# Patient Record
Sex: Female | Born: 1990 | Race: White | Hispanic: No | Marital: Single | Smoking: Never smoker
Health system: Southern US, Community
[De-identification: ages and names within clinical notes are randomized; demographics above are authoritative.]

## PROBLEM LIST (undated history)

## (undated) DIAGNOSIS — N946 Dysmenorrhea, unspecified: Secondary | ICD-10-CM

## (undated) DIAGNOSIS — N390 Urinary tract infection, site not specified: Secondary | ICD-10-CM

## (undated) HISTORY — PX: WISDOM TOOTH EXTRACTION: SHX21

## (undated) HISTORY — DX: Dysmenorrhea, unspecified: N94.6

## (undated) HISTORY — DX: Urinary tract infection, site not specified: N39.0

---
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
## 2011-01-24 LAB — WET PREP, GENITAL
Clue Cells Wet Prep HPF POC: NONE SEEN
Yeast Wet Prep HPF POC: NONE SEEN

## 2011-01-24 LAB — GC/CHLAMYDIA PROBE AMP, GENITAL
Chlamydia, DNA Probe: NEGATIVE
GC Probe Amp, Genital: NEGATIVE

## 2011-01-24 LAB — URINALYSIS, ROUTINE W REFLEX MICROSCOPIC
Bilirubin Urine: NEGATIVE
Ketones, ur: NEGATIVE mg/dL
Nitrite: NEGATIVE
Urobilinogen, UA: 0.2 mg/dL (ref 0.0–1.0)

## 2013-06-16 ENCOUNTER — Ambulatory Visit (INDEPENDENT_AMBULATORY_CARE_PROVIDER_SITE_OTHER): Payer: BC Managed Care – PPO | Admitting: Nurse Practitioner

## 2013-06-16 ENCOUNTER — Encounter: Payer: Self-pay | Admitting: Nurse Practitioner

## 2013-06-16 VITALS — BP 110/64 | HR 76 | Ht 65.0 in | Wt 136.0 lb

## 2013-06-16 DIAGNOSIS — N9089 Other specified noninflammatory disorders of vulva and perineum: Secondary | ICD-10-CM

## 2013-06-16 MED ORDER — FLUCONAZOLE 150 MG PO TABS: 150.0000 mg | ORAL_TABLET | Freq: Once | ORAL | Status: DC

## 2013-06-16 NOTE — Progress Notes (Signed)
Subjective:     Patient ID: Caitlin Shaw, female   DOB: [birth year], 22 y.o.   MRN: [id number]  HPI  22 yo SW Fe presents with symptoms of vaginitis X 2 days. Vaginal discharge is scant, no odor, no bladder symptoms. No fever/chills.  She did use  new brand of tampons this last cycle. No other changes in personal products. She is going out of town this weekend with her boyfriend and his parents   Review of Systems  Constitutional: Negative for fever, chills and fatigue.  Respiratory: Negative.   Cardiovascular: Negative.   Gastrointestinal: Negative.   Genitourinary: Negative for dysuria, urgency, frequency, flank pain, decreased urine volume, genital sores, pelvic pain and dyspareunia.       Last sexually active 1 week ago.  Same monogamous partner.  On OCP. Just finished cycle yesterday.  Skin: Negative.   Neurological: Negative.   Psychiatric/Behavioral: Negative.        Objective:   Physical Exam  Constitutional: She is oriented to person, place, and time. She appears well-developed and well-nourished.  Abdominal: Soft. She exhibits no distension and no mass. There is no tenderness. There is no rebound and no guarding.  Genitourinary:  exteanal vulva areas are slight red with irritation like scratch marks. There is no vaginal discharge. Look like an allergic reaction.  Neurological: She is alert and oriented to person, place, and time.  Psychiatric: She has a normal mood and affect. Her behavior is normal. Judgment and thought content normal.       Assessment:     Vulvar irritation maybe from an allergic reaction to change of products     Plan:     Suggest OTC hydrocortisone cream prn Will give her Diflucan 150 in case this is a early yeast infection.

## 2013-06-17 NOTE — Progress Notes (Signed)
Encounter reviewed by Dr. Brook Silva.  

## 2013-07-13 ENCOUNTER — Other Ambulatory Visit: Payer: Self-pay | Admitting: Nurse Practitioner

## 2013-07-13 DIAGNOSIS — Z3040 Encounter for surveillance of contraceptives, unspecified: Secondary | ICD-10-CM

## 2013-07-13 NOTE — Telephone Encounter (Signed)
[facility] request for refill on Microgestin 1/20 Fe #84 Last filled - 04-23-13 Last AEX - 07-13-12 Next AEX - 08-01-13

## 2013-08-01 ENCOUNTER — Ambulatory Visit: Payer: Self-pay | Admitting: Nurse Practitioner

## 2013-08-26 ENCOUNTER — Ambulatory Visit: Payer: Self-pay | Admitting: Nurse Practitioner

## 2013-09-09 ENCOUNTER — Ambulatory Visit (INDEPENDENT_AMBULATORY_CARE_PROVIDER_SITE_OTHER): Payer: BC Managed Care – PPO | Admitting: Nurse Practitioner

## 2013-09-09 ENCOUNTER — Encounter: Payer: Self-pay | Admitting: Nurse Practitioner

## 2013-09-09 VITALS — BP 110/66 | HR 92 | Ht 65.5 in | Wt 142.0 lb

## 2013-09-09 DIAGNOSIS — Z01419 Encounter for gynecological examination (general) (routine) without abnormal findings: Secondary | ICD-10-CM

## 2013-09-09 DIAGNOSIS — Z0000 Encounter for general adult medical examination without abnormal findings: Secondary | ICD-10-CM

## 2013-09-09 LAB — POCT URINALYSIS DIPSTICK
Blood, UA: NEGATIVE
Glucose, UA: NEGATIVE
Ketones, UA: NEGATIVE
Protein, UA: NEGATIVE
Urobilinogen, UA: NEGATIVE

## 2013-09-09 MED ORDER — NORETHIN ACE-ETH ESTRAD-FE 1-20 MG-MCG PO TABS: 1.0000 | ORAL_TABLET | Freq: Every day | ORAL | Status: DC

## 2013-09-09 NOTE — Progress Notes (Signed)
Patient ID: Caitlin Shaw, female   DOB: 16-May-1991, 22 y.o.   MRN: [id number] 22 y.o. G0P0 Single Caucasian Fe here for annual exam.  [facility] at [place].   Menses now at 4-5 days, moderate to light. Occasional mild cramps.  Same partner for 6 years.  No concerns about STD's.   Patient's last menstrual period was 09/02/2013.          Sexually active: yes  The current method of family planning is OCP (estrogen/progesterone) and condoms most of the time.    Exercising: yes  Home exercise routine includes running 3-5 times per week. Smoker:  no  Health Maintenance: TDaP:  UTD ? 2012 Gardasil: completed in 2012 Labs: HB: 12.6 Urine: negative, pH 6.5   reports that she has never smoked. She has never used smokeless tobacco. She reports that she drinks about 1.0 ounces of alcohol per week. She reports that she does not use illicit drugs.  Past Medical History  Diagnosis Date  . Dysmenorrhea     Past Surgical History  Procedure Laterality Date  . Wisdom tooth extraction      Current Outpatient Prescriptions  Medication Sig Dispense Refill  . fluconazole (DIFLUCAN) 150 MG tablet Take 1 tablet (150 mg total) by mouth once. Take one tablet.  Repeat in 48 hours if symptoms are not completely resolved.  2 tablet  0  . minocycline (MINOCIN,DYNACIN) 100 MG capsule Take 100 mg by mouth 2 (two) times daily.      . norethindrone-ethinyl estradiol (JUNEL FE,GILDESS FE,LOESTRIN FE) 1-20 MG-MCG tablet Take 1 tablet by mouth daily.  3 Package  3   No current facility-administered medications for this visit.    Family History  Problem Relation Age of Onset  . Osteoporosis Mother   . Thyroid disease Father     ROS:  Pertinent items are noted in HPI.  Otherwise, a comprehensive ROS was negative.  Exam:   BP 110/66  Pulse 92  Ht 5' 5.5" (1.664 m)  Wt 142 lb (64.411 kg)  BMI 23.26 kg/m2  LMP 09/02/2013 Height: 5' 5.5" (166.4 cm)  Ht Readings from Last 3 Encounters:  09/09/13 5' 5.5" (1.664  m)  06/16/13 5\' 5"  (1.651 m)    General appearance: alert, cooperative and appears stated age Head: Normocephalic, without obvious abnormality, atraumatic Neck: no adenopathy, supple, symmetrical, trachea midline and thyroid normal to inspection and palpation Lungs: clear to auscultation bilaterally Breasts: normal appearance, no masses or tenderness Heart: regular rate and rhythm Abdomen: soft, non-tender; no masses,  no organomegaly Extremities: extremities normal, atraumatic, no cyanosis or edema Skin: Skin color, texture, turgor normal. No rashes or lesions Lymph nodes: Cervical, supraclavicular, and axillary nodes normal. No abnormal inguinal nodes palpated Neurologic: Grossly normal   Pelvic: External genitalia:  no lesions              Urethra:  normal appearing urethra with no masses, tenderness or lesions              Bartholin's and Skene's: normal                 Vagina: normal appearing vagina with normal color and discharge, no lesions              Cervix: anteverted              Pap taken: yes Bimanual Exam:  Uterus:  normal size, contour, position, consistency, mobility, non-tender  Adnexa: no mass, fullness, tenderness               Rectovaginal: Confirms               Anus:  normal sphincter tone, no lesions  A:  Well Woman with normal exam  Contraception  History of dysmenorrhea  P:   Pap smear as per guidelines Pap done today  Refill on Loestrin 1/20 for a year  Counseled on breast self exam, STD prevention, adequate intake of calcium and vitamin D, diet and exercise return annually or prn  An After Visit Summary was printed and given to the patient.

## 2013-09-09 NOTE — Patient Instructions (Signed)
General topics  Next pap or exam is  due in 1 year Take a Women's multivitamin Take 1200 mg. of calcium daily - prefer dietary If any concerns in interim to call back  Breast Self-Awareness Practicing breast self-awareness may pick up problems early, prevent significant medical complications, and possibly save your life. By practicing breast self-awareness, you can become familiar with how your breasts look and feel and if your breasts are changing. This allows you to notice changes early. It can also offer you some reassurance that your breast health is good. One way to learn what is normal for your breasts and whether your breasts are changing is to do a breast self-exam. If you find a lump or something that was not present in the past, it is best to contact your caregiver right away. Other findings that should be evaluated by your caregiver include nipple discharge, especially if it is bloody; skin changes or reddening; areas where the skin seems to be pulled in (retracted); or new lumps and bumps. Breast pain is seldom associated with cancer (malignancy), but should also be evaluated by a caregiver. BREAST SELF-EXAM The best time to examine your breasts is 5 7 days after your menstrual period is over.  ExitCare Patient Information 2013 [employer].   Exercise to Stay Healthy Exercise helps you become and stay healthy. EXERCISE IDEAS AND TIPS Choose exercises that:  You enjoy.  Fit into your day. You do not need to exercise really hard to be healthy. You can do exercises at a slow or medium level and stay healthy. You can:  Stretch before and after working out.  Try yoga, Pilates, or tai chi.  Lift weights.  Walk fast, swim, jog, run, climb stairs, bicycle, dance, or rollerskate.  Take aerobic classes. Exercises that burn about 150 calories:  Running 1  miles in 15 minutes.  Playing volleyball for 45 to 60 minutes.  Washing and waxing a car for 45 to 60  minutes.  Playing touch football for 45 minutes.  Walking 1  miles in 35 minutes.  Pushing a stroller 1  miles in 30 minutes.  Playing basketball for 30 minutes.  Raking leaves for 30 minutes.  Bicycling 5 miles in 30 minutes.  Walking 2 miles in 30 minutes.  Dancing for 30 minutes.  Shoveling snow for 15 minutes.  Swimming laps for 20 minutes.  Walking up stairs for 15 minutes.  Bicycling 4 miles in 15 minutes.  Gardening for 30 to 45 minutes.  Jumping rope for 15 minutes.  Washing windows or floors for 45 to 60 minutes. Document Released: 11/29/2010 Document Revised: 01/19/2012 Document Reviewed: 11/29/2010 ExitCare Patient Information 2013 [employer].   Other topics ( that may be useful information):    Sexually Transmitted Disease Sexually transmitted disease (STD) refers to any infection that is passed from person to person during sexual activity. This may happen by way of saliva, semen, blood, vaginal mucus, or urine. Common STDs include:  Gonorrhea.  Chlamydia.  Syphilis.  HIV/AIDS.  Genital herpes.  Hepatitis B and C.  Trichomonas.  Human papillomavirus (HPV).  Pubic lice. CAUSES  An STD may be spread by bacteria, virus, or parasite. A person can get an STD by:  Sexual intercourse with an infected person.  Sharing sex toys with an infected person.  Sharing needles with an infected person.  Having intimate contact with the genitals, mouth, or rectal areas of an infected person. SYMPTOMS  Some people may not have any symptoms, but   they can still pass the infection to others. Different STDs have different symptoms. Symptoms include:  Painful or bloody urination.  Pain in the pelvis, abdomen, vagina, anus, throat, or eyes.  Skin rash, itching, irritation, growths, or sores (lesions). These usually occur in the genital or anal area.  Abnormal vaginal discharge.  Penile discharge in men.  Soft, flesh-colored skin growths in the  genital or anal area.  Fever.  Pain or bleeding during sexual intercourse.  Swollen glands in the groin area.  Yellow skin and eyes (jaundice). This is seen with hepatitis. DIAGNOSIS  To make a diagnosis, your caregiver may:  Take a medical history.  Perform a physical exam.  Take a specimen (culture) to be examined.  Examine a sample of discharge under a microscope.  Perform blood test TREATMENT   Chlamydia, gonorrhea, trichomonas, and syphilis can be cured with antibiotic medicine.  Genital herpes, hepatitis, and HIV can be treated, but not cured, with prescribed medicines. The medicines will lessen the symptoms.  Genital warts from HPV can be treated with medicine or by freezing, burning (electrocautery), or surgery. Warts may come back.  HPV is a virus and cannot be cured with medicine or surgery.However, abnormal areas may be followed very closely by your caregiver and may be removed from the cervix, vagina, or vulva through office procedures or surgery. If your diagnosis is confirmed, your recent sexual partners need treatment. This is true even if they are symptom-free or have a negative culture or evaluation. They should not have sex until their caregiver says it is okay. HOME CARE INSTRUCTIONS  All sexual partners should be informed, tested, and treated for all STDs.  Take your antibiotics as directed. Finish them even if you start to feel better.  Only take over-the-counter or prescription medicines for pain, discomfort, or fever as directed by your caregiver.  Rest.  Eat a balanced diet and drink enough fluids to keep your urine clear or pale yellow.  Do not have sex until treatment is completed and you have followed up with your caregiver. STDs should be checked after treatment.  Keep all follow-up appointments, Pap tests, and blood tests as directed by your caregiver.  Only use latex condoms and water-soluble lubricants during sexual activity. Do not use  petroleum jelly or oils.  Avoid alcohol and illegal drugs.  Get vaccinated for HPV and hepatitis. If you have not received these vaccines in the past, talk to your caregiver about whether one or both might be right for you.  Avoid risky sex practices that can break the skin. The only way to avoid getting an STD is to avoid all sexual activity.Latex condoms and dental dams (for oral sex) will help lessen the risk of getting an STD, but will not completely eliminate the risk. SEEK MEDICAL CARE IF:   You have a fever.  You have any new or worsening symptoms. Document Released: 01/17/2003 Document Revised: 01/19/2012 Document Reviewed: 01/24/2011 ExitCare Patient Information 2013 [employer].    Domestic Abuse You are being battered or abused if someone close to you hits, pushes, or physically hurts you in any way. You also are being abused if you are forced into activities. You are being sexually abused if you are forced to have sexual contact of any kind. You are being emotionally abused if you are made to feel worthless or if you are constantly threatened. It is important to remember that help is available. No one has the right to abuse you. PREVENTION OF FURTHER   ABUSE  Learn the warning signs of danger. This varies with situations but may include: the use of alcohol, threats, isolation from friends and family, or forced sexual contact. Leave if you feel that violence is going to occur.  If you are attacked or beaten, report it to the police so the abuse is documented. You do not have to press charges. The police can protect you while you or the attackers are leaving. Get the officer's name and badge number and a copy of the report.  Find someone you can trust and tell them what is happening to you: your caregiver, a nurse, clergy member, close friend or family member. Feeling ashamed is natural, but remember that you have done nothing wrong. No one deserves abuse. Document Released:  10/24/2000 Document Revised: 01/19/2012 Document Reviewed: 01/02/2011 ExitCare Patient Information 2013 [employer].    How Much is Too Much Alcohol? Drinking too much alcohol can cause injury, accidents, and health problems. These types of problems can include:   Car crashes.  Falls.  Family fighting (domestic violence).  Drowning.  Fights.  Injuries.  Burns.  Damage to certain organs.  Having a baby with birth defects. ONE DRINK CAN BE TOO MUCH WHEN YOU ARE:  Working.  Pregnant or breastfeeding.  Taking medicines. Ask your doctor.  Driving or planning to drive. If you or someone you know has a drinking problem, get help from a doctor.  Document Released: 08/23/2009 Document Revised: 01/19/2012 Document Reviewed: 08/23/2009 ExitCare Patient Information 2013 [employer].   Smoking Hazards Smoking cigarettes is extremely bad for your health. Tobacco smoke has over 200 known poisons in it. There are over 60 chemicals in tobacco smoke that cause cancer. Some of the chemicals found in cigarette smoke include:   Cyanide.  Benzene.  Formaldehyde.  Methanol (wood alcohol).  Acetylene (fuel used in welding torches).  Ammonia. Cigarette smoke also contains the poisonous gases nitrogen oxide and carbon monoxide.  Cigarette smokers have an increased risk of many serious medical problems and Smoking causes approximately:  90% of all lung cancer deaths in men.  80% of all lung cancer deaths in women.  90% of deaths from chronic obstructive lung disease. Compared with nonsmokers, smoking increases the risk of:  Coronary heart disease by 2 to 4 times.  Stroke by 2 to 4 times.  Men developing lung cancer by 23 times.  Women developing lung cancer by 13 times.  Dying from chronic obstructive lung diseases by 12 times.  . Smoking is the most preventable cause of death and disease in our society.  WHY IS SMOKING ADDICTIVE?  Nicotine is the chemical  agent in tobacco that is capable of causing addiction or dependence.  When you smoke and inhale, nicotine is absorbed rapidly into the bloodstream through your lungs. Nicotine absorbed through the lungs is capable of creating a powerful addiction. Both inhaled and non-inhaled nicotine may be addictive.  Addiction studies of cigarettes and spit tobacco show that addiction to nicotine occurs mainly during the teen years, when young people begin using tobacco products. WHAT ARE THE BENEFITS OF QUITTING?  There are many health benefits to quitting smoking.   Likelihood of developing cancer and heart disease decreases. Health improvements are seen almost immediately.  Blood pressure, pulse rate, and breathing patterns start returning to normal soon after quitting. QUITTING SMOKING   American Lung Association - 1-800-LUNGUSA  American Cancer Society - 1-800-ACS-2345 Document Released: 12/04/2004 Document Revised: 01/19/2012 Document Reviewed: 08/08/2009 ExitCare Patient Information 2013 ExitCare[employer]   LL[employer].   Stress Management Stress is a state of physical or mental tension that often results from changes in your life or normal routine. Some common causes of stress are:  Death of a loved one.  Injuries or severe illnesses.  Getting fired or changing jobs.  Moving into a new home. Other causes may be:  Sexual problems.  Business or financial losses.  Taking on a large debt.  Regular conflict with someone at home or at work.  Constant tiredness from lack of sleep. It is not just bad things that are stressful. It may be stressful to:  Win the lottery.  Get married.  Buy a new car. The amount of stress that can be easily tolerated varies from person to person. Changes generally cause stress, regardless of the types of change. Too much stress can affect your health. It may lead to physical or emotional problems. Too little stress (boredom) may also become stressful. SUGGESTIONS TO  REDUCE STRESS:  Talk things over with your family and friends. It often is helpful to share your concerns and worries. If you feel your problem is serious, you may want to get help from a professional counselor.  Consider your problems one at a time instead of lumping them all together. Trying to take care of everything at once may seem impossible. List all the things you need to do and then start with the most important one. Set a goal to accomplish 2 or 3 things each day. If you expect to do too many in a single day you will naturally fail, causing you to feel even more stressed.  Do not use alcohol or drugs to relieve stress. Although you may feel better for a short time, they do not remove the problems that caused the stress. They can also be habit forming.  Exercise regularly - at least 3 times per week. Physical exercise can help to relieve that "uptight" feeling and will relax you.  The shortest distance between despair and hope is often a good night's sleep.  Go to bed and get up on time allowing yourself time for appointments without being rushed.  Take a short "time-out" period from any stressful situation that occurs during the day. Close your eyes and take some deep breaths. Starting with the muscles in your face, tense them, hold it for a few seconds, then relax. Repeat this with the muscles in your neck, shoulders, hand, stomach, back and legs.  Take good care of yourself. Eat a balanced diet and get plenty of rest.  Schedule time for having fun. Take a break from your daily routine to relax. HOME CARE INSTRUCTIONS   Call if you feel overwhelmed by your problems and feel you can no longer manage them on your own.  Return immediately if you feel like hurting yourself or someone else. Document Released: 04/22/2001 Document Revised: 01/19/2012 Document Reviewed: 12/13/2007 ExitCare Patient Information 2013 [employer].   

## 2013-09-11 NOTE — Progress Notes (Signed)
Encounter reviewed by Dr. Brook Silva.  

## 2013-09-12 LAB — IPS PAP TEST WITH REFLEX TO HPV

## 2013-09-19 ENCOUNTER — Other Ambulatory Visit: Payer: Self-pay | Admitting: Family Medicine

## 2013-09-19 DIAGNOSIS — R51 Headache: Secondary | ICD-10-CM

## 2013-09-23 ENCOUNTER — Ambulatory Visit
Admission: RE | Admit: 2013-09-23 | Discharge: 2013-09-23 | Disposition: A | Discharge status: Home / Self Care | Payer: BC Managed Care – PPO | Source: Ambulatory Visit | Attending: Family Medicine | Admitting: Family Medicine

## 2013-09-23 ENCOUNTER — Other Ambulatory Visit: Payer: Self-pay

## 2013-09-23 DIAGNOSIS — R51 Headache: Secondary | ICD-10-CM

## 2013-10-13 ENCOUNTER — Telehealth: Payer: Self-pay | Admitting: Nurse Practitioner

## 2013-10-13 MED ORDER — NORETHIN ACE-ETH ESTRAD-FE 1-20 MG-MCG PO TABS: 1.0000 | ORAL_TABLET | Freq: Every day | ORAL | Status: DC

## 2013-10-13 NOTE — Telephone Encounter (Signed)
Patient wants to switch her current bc back to the one she was on before Microgesten

## 2013-10-13 NOTE — Telephone Encounter (Signed)
Caitlin Shaw patient is requesting to change birth control pills. Currently on Loestrin. Per paper chart patient was on Minastrin prior. Chart to your desk. Please advise.

## 2013-10-13 NOTE — Telephone Encounter (Signed)
Spoke with patient. She states that she wants Microgestin FE 1/20. Spoke with her pharmacy and they do have it there for a generic for Junel 1/20 FE. Advised patient. Sent rx with patient request that she specifically would like the Microgestin as the generic.

## 2013-10-13 NOTE — Telephone Encounter (Signed)
When she first came to see [place] 07/29/12 she had started on OCP in  2010 with Minastrin 24 (which used to be called Loestrin 24).  Because of cost she was changed to Loestrin 1/20.  So not sure what she took before all this.  May need to ask her for clarification.

## 2013-11-08 ENCOUNTER — Encounter: Payer: Self-pay | Admitting: Nurse Practitioner

## 2013-11-08 ENCOUNTER — Ambulatory Visit (INDEPENDENT_AMBULATORY_CARE_PROVIDER_SITE_OTHER): Payer: BC Managed Care – PPO | Admitting: Nurse Practitioner

## 2013-11-08 VITALS — BP 100/66 | HR 60 | Ht 65.5 in | Wt 142.0 lb

## 2013-11-08 DIAGNOSIS — B3731 Acute candidiasis of vulva and vagina: Secondary | ICD-10-CM

## 2013-11-08 DIAGNOSIS — B373 Candidiasis of vulva and vagina: Secondary | ICD-10-CM

## 2013-11-08 MED ORDER — FLUCONAZOLE 150 MG PO TABS: 150.0000 mg | ORAL_TABLET | Freq: Once | ORAL | Status: DC

## 2013-11-08 NOTE — Progress Notes (Signed)
Encounter reviewed by Dr. Leoni Goodness Silva.  

## 2013-11-08 NOTE — Progress Notes (Signed)
Subjective:     Patient ID: Caitlin Shaw, female   DOB: Nov 22, 1990, 22 y.o.   MRN: [id number]  HPI This 22 yo SW Fe complaints of vaginal itching for 3 days.  No vaginal  discharge. Symptoms are mostly external and cause irritation with wearing pants. She has a chronic history of yeast vaginitis.  No bladder symptoms.  No change in SA. Same partner for 6 years.  Still on OCP.  She is home from [facility] for the [place].  Review of Systems  Constitutional: Negative for fever and chills.  Gastrointestinal: Negative for nausea, vomiting and abdominal pain.  Genitourinary: Positive for vaginal discharge. Negative for dysuria, urgency, frequency, flank pain, decreased urine volume, pelvic pain and dyspareunia.       Objective:   Physical Exam  Constitutional: She appears well-developed and well-nourished.  Abdominal: She exhibits no distension. There is no tenderness.  Genitourinary:  External vulva with redness and linear cuts consistent with yeast.  Very little vaginal discharge. No cervicitis. Wet prep: PH 4.; NSS: neg; KOH: + yeast       Assessment:     Yeast vaginitis - acute and chronic    Plan:     Diflucan 150 mg weekly X 4 RTO if symptoms worsens

## 2013-11-08 NOTE — Patient Instructions (Signed)
Monilial Vaginitis  Vaginitis in a soreness, swelling and redness (inflammation) of the vagina and vulva. Monilial vaginitis is not a sexually transmitted infection.  CAUSES   Yeast vaginitis is caused by yeast (candida) that is normally found in your vagina. With a yeast infection, the candida has overgrown in number to a point that upsets the chemical balance.  SYMPTOMS   · White, thick vaginal discharge.  · Swelling, itching, redness and irritation of the vagina and possibly the lips of the vagina (vulva).  · Burning or painful urination.  · Painful intercourse.  DIAGNOSIS   Things that may contribute to monilial vaginitis are:  · Postmenopausal and virginal states.  · Pregnancy.  · Infections.  · Being tired, sick or stressed, especially if you had monilial vaginitis in the past.  · Diabetes. Good control will help lower the chance.  · Birth control pills.  · Tight fitting garments.  · Using bubble bath, feminine sprays, douches or deodorant tampons.  · Taking certain medications that kill germs (antibiotics).  · Sporadic recurrence can occur if you become ill.  TREATMENT   Your caregiver will give you medication.  · There are several kinds of anti monilial vaginal creams and suppositories specific for monilial vaginitis. For recurrent yeast infections, use a suppository or cream in the vagina 2 times a week, or as directed.  · Anti-monilial or steroid cream for the itching or irritation of the vulva may also be used. Get your caregiver's permission.  · Painting the vagina with methylene blue solution may help if the monilial cream does not work.  · Eating yogurt may help prevent monilial vaginitis.  HOME CARE INSTRUCTIONS   · Finish all medication as prescribed.  · Do not have sex until treatment is completed or after your caregiver tells you it is okay.  · Take warm sitz baths.  · Do not douche.  · Do not use tampons, especially scented ones.  · Wear cotton underwear.  · Avoid tight pants and panty  hose.  · Tell your sexual partner that you have a yeast infection. They should go to their caregiver if they have symptoms such as mild rash or itching.  · Your sexual partner should be treated as well if your infection is difficult to eliminate.  · Practice safer sex. Use condoms.  · Some vaginal medications cause latex condoms to fail. Vaginal medications that harm condoms are:  · Cleocin cream.  · Butoconazole (Femstat®).  · Terconazole (Terazol®) vaginal suppository.  · Miconazole (Monistat®) (may be purchased over the counter).  SEEK MEDICAL CARE IF:   · You have a temperature by mouth above 102° F (38.9° C).  · The infection is getting worse after 2 days of treatment.  · The infection is not getting better after 3 days of treatment.  · You develop blisters in or around your vagina.  · You develop vaginal bleeding, and it is not your menstrual period.  · You have pain when you urinate.  · You develop intestinal problems.  · You have pain with sexual intercourse.  Document Released: 08/06/2005 Document Revised: 01/19/2012 Document Reviewed: 04/20/2009  ExitCare® Patient Information ©2014 [employer].

## 2014-04-10 ENCOUNTER — Telehealth: Payer: Self-pay | Admitting: Nurse Practitioner

## 2014-04-10 ENCOUNTER — Ambulatory Visit: Payer: BC Managed Care – PPO | Admitting: Gynecology

## 2014-04-10 ENCOUNTER — Telehealth: Payer: Self-pay | Admitting: Gynecology

## 2014-04-10 DIAGNOSIS — N390 Urinary tract infection, site not specified: Secondary | ICD-10-CM

## 2014-04-10 HISTORY — DX: Urinary tract infection, site not specified: N39.0

## 2014-04-10 NOTE — Telephone Encounter (Signed)
Patient called and cancelled her appointment for yeast infection that was just scheduled this morning with Dr. Farrel Gobble as she is feeling better. The patient will call if symptoms worsen. I did not charge the patient a cancellation fee as it was just scheduled.

## 2014-04-10 NOTE — Telephone Encounter (Signed)
Spoke with patient. Advised of appointment availability at 2:45pm with Dr.Lathrop (time per Kennon Rounds). Patient agreeable to date and time. Appointment scheduled.  CC:Dr.Lathrop  Routing to provider for final review. Patient agreeable to disposition. Will close encounter

## 2014-04-10 NOTE — Telephone Encounter (Signed)
Pt has a yeast infection and wanting to come in today. i did not see an appointment for her.

## 2014-04-10 NOTE — Telephone Encounter (Signed)
Spoke with patient. Patient states " I get yeast infections kind of regularly. I have diflucan that I take when it comes on and that usually knocks it out. I took one on Friday when I first started having symptoms but it didn't help so I took a second one on Saturday. I also started Monistat on Saturday. It is not intolerable but I do not feel like it is getting better." Advised patient appointment available with Lauro Franklin, FNP tomorrow. Patient declines stating " I have to move back to [place] today and I will be gone by 5." Patient requesting to be seen today "I just want to make sure that it is a yeast infection before I leave." Advised patient would have to check scheduling and with Lauro Franklin, FNP and give patient a call back with appointment availability. Patient agreeable.

## 2014-05-18 ENCOUNTER — Ambulatory Visit (INDEPENDENT_AMBULATORY_CARE_PROVIDER_SITE_OTHER): Payer: BC Managed Care – PPO | Admitting: Certified Nurse Midwife

## 2014-05-18 ENCOUNTER — Encounter: Payer: Self-pay | Admitting: Certified Nurse Midwife

## 2014-05-18 VITALS — BP 108/68 | HR 68 | Temp 99.4°F | Resp 16 | Ht 65.5 in | Wt 139.0 lb

## 2014-05-18 DIAGNOSIS — N390 Urinary tract infection, site not specified: Secondary | ICD-10-CM

## 2014-05-18 DIAGNOSIS — N760 Acute vaginitis: Secondary | ICD-10-CM

## 2014-05-18 DIAGNOSIS — Z202 Contact with and (suspected) exposure to infections with a predominantly sexual mode of transmission: Secondary | ICD-10-CM

## 2014-05-18 LAB — POCT URINALYSIS DIPSTICK
Bilirubin, UA: NEGATIVE
Glucose, UA: NEGATIVE
Ketones, UA: NEGATIVE
NITRITE UA: NEGATIVE
PROTEIN UA: NEGATIVE
UROBILINOGEN UA: NEGATIVE
pH, UA: 5

## 2014-05-18 MED ORDER — PHENAZOPYRIDINE HCL 200 MG PO TABS: 200.0000 mg | ORAL_TABLET | Freq: Three times a day (TID) | ORAL | Status: DC | PRN

## 2014-05-18 MED ORDER — NITROFURANTOIN MONOHYD MACRO 100 MG PO CAPS: 100.0000 mg | ORAL_CAPSULE | Freq: Two times a day (BID) | ORAL | Status: DC

## 2014-05-18 MED ORDER — METRONIDAZOLE 0.75 % VA GEL: VAGINAL | Status: DC

## 2014-05-18 NOTE — Progress Notes (Signed)
23 y.o. single white female g0p0 here with complaint of UTI, with onset  of 2 days ago. Patient complaining of urinary frequency/urgency/ and pain with urination. Patient denies fever, chills, nausea or back pain. No new personal products. Patient feels is related to sexual activity. Complaining of vaginal discomfort from numerous days of sexual activity during the week of the fourth of July. Patient partner lives in [place] so they were together "alot per patient". Patient has difference in discharge with some odor and burning. Patient on daily Principen for facial acne also. [place] STD screening same partner.No new personal products. Contraception is OCP with consistent use. No other health issues. Mother with patient at patient request to ask questions. Mother not present for personal discussion or exam.   O: Healthy female WDWN Affect: Normal, orientation x 3 Skin : warm and dry CVAT: negative bilateral Abdomen: positive for suprapubic tenderness  Pelvic exam: External genital area: normal, no lesions, small sebaceous cyst noted on right vulva, non tender, tiny Bladder,Urethra tender, Urethral meatus: tender and red appearance Vagina:grey wateryl vaginal discharge,with odor, ph 5.5  Wet prep taken Cervix: normal, non tender specimens taken Uterus:normal,non tender Adnexa: normal non tender, no fullness or masses  Poct urine-rbc 2+, wbc small Wet Prep positive for clue cells A: UTI BV STD screening Asymptomatic right vulva sebaceous  P: Reviewed findings of UTI and possible etiology. Instructed to stop other antibiotic for her face until completes treatment EA:[id number]:Macrobid see order Rx Pyridium see order JYN:WGNFALab:Urine micro, culture Reviewed warning signs and symptoms of UTI and will recheck in 2 weeks and discuss post-coital UTI Encouraged to limit soda, tea, and coffee Reviewed findings of BV and etiology. Questions addressed. Rx Metrogel with instructions see order Lab:GC,Chlamydia,  HIV,RPR Discussed sebaceous and no treatment needed. Limit frequent shaving.  Rv 2 weeks, prn    10  miinutes spent with patient with >50% of time spent in face to face counseling.

## 2014-05-18 NOTE — Patient Instructions (Addendum)
Bacterial Vaginosis Bacterial vaginosis is an infection of the vagina. It happens when too many of certain germs (bacteria) grow in the vagina. HOME CARE  Take your medicine as told by your doctor.  Finish your medicine even if you start to feel better.  Do not have sex until you finish your medicine and are better.  Tell your sex partner that you have an infection. They should see their doctor for treatment.  Practice safe sex. Use condoms. Have only one sex partner. GET HELP IF:  You are not getting better after 3 days of treatment.  You have more grey fluid (discharge) coming from your vagina than before.  You have more pain than before.  You have a fever. MAKE SURE YOU:   Understand these instructions.  Will watch your condition.  Will get help right away if you are not doing well or get worse. Document Released: 08/05/2008 Document Revised: 08/17/2013 Document Reviewed: 06/08/2013 ExitCare Patient Information 2015 [employer]. This information is not intended to replace advice given to you by your health care provider. Make sure you discuss any questions you have with your health care provider. Urinary Tract Infection Urinary tract infections (UTIs) can develop anywhere along your urinary tract. Your urinary tract is your body's drainage system for removing wastes and extra water. Your urinary tract includes two kidneys, two ureters, a bladder, and a urethra. Your kidneys are a pair of bean-shaped organs. Each kidney is about the size of your fist. They are located below your ribs, one on each side of your spine. CAUSES Infections are caused by microbes, which are microscopic organisms, including fungi, viruses, and bacteria. These organisms are so small that they can only be seen through a microscope. Bacteria are the microbes that most commonly cause UTIs. SYMPTOMS  Symptoms of UTIs may vary by age and gender of the patient and by the location of the infection. Symptoms  in young women typically include a frequent and intense urge to urinate and a painful, burning feeling in the bladder or urethra during urination. Older women and men are more likely to be tired, shaky, and weak and have muscle aches and abdominal pain. A fever may mean the infection is in your kidneys. Other symptoms of a kidney infection include pain in your back or sides below the ribs, nausea, and vomiting. DIAGNOSIS To diagnose a UTI, your caregiver will ask you about your symptoms. Your caregiver also will ask to provide a urine sample. The urine sample will be tested for bacteria and white blood cells. White blood cells are made by your body to help fight infection. TREATMENT  Typically, UTIs can be treated with medication. Because most UTIs are caused by a bacterial infection, they usually can be treated with the use of antibiotics. The choice of antibiotic and length of treatment depend on your symptoms and the type of bacteria causing your infection. HOME CARE INSTRUCTIONS  If you were prescribed antibiotics, take them exactly as your caregiver instructs you. Finish the medication even if you feel better after you have only taken some of the medication.  Drink enough water and fluids to keep your urine clear or pale yellow.  Avoid caffeine, tea, and carbonated beverages. They tend to irritate your bladder.  Empty your bladder often. Avoid holding urine for long periods of time.  Empty your bladder before and after sexual intercourse.  After a bowel movement, women should cleanse from front to back. Use each tissue only once. SEEK MEDICAL CARE   IF:   You have back pain.  You develop a fever.  Your symptoms do not begin to resolve within 3 days. SEEK IMMEDIATE MEDICAL CARE IF:   You have severe back pain or lower abdominal pain.  You develop chills.  You have nausea or vomiting.  You have continued burning or discomfort with urination. MAKE SURE YOU:   Understand these  instructions.  Will watch your condition.  Will get help right away if you are not doing well or get worse. Document Released: 08/06/2005 Document Revised: 04/27/2012 Document Reviewed: 12/05/2011 ExitCare Patient Information 2015 [employer]. This information is not intended to replace advice given to you by your health care provider. Make sure you discuss any questions you have with your health care provider.  

## 2014-05-19 LAB — URINALYSIS, MICROSCOPIC ONLY
BACTERIA UA: NONE SEEN
CASTS: NONE SEEN
CRYSTALS: NONE SEEN
Squamous Epithelial / LPF: NONE SEEN

## 2014-05-19 LAB — STD PANEL
HIV 1&2 Ab, 4th Generation: NONREACTIVE
Hepatitis B Surface Ag: NEGATIVE

## 2014-05-19 NOTE — Progress Notes (Signed)
Reviewed personally.  M. Suzanne Darvin Dials, MD.  

## 2014-05-20 LAB — IPS N GONORRHOEA AND CHLAMYDIA BY PCR

## 2014-05-21 LAB — URINE CULTURE: Colony Count: >=100000

## 2014-05-22 ENCOUNTER — Telehealth: Payer: Self-pay

## 2014-05-22 MED ORDER — CIPROFLOXACIN HCL 500 MG PO TABS: 500.0000 mg | ORAL_TABLET | Freq: Two times a day (BID) | ORAL | Status: DC

## 2014-05-22 NOTE — Telephone Encounter (Signed)
Patient is calling joy back °

## 2014-05-22 NOTE — Telephone Encounter (Signed)
Message copied by Eliezer BottomJOHNSON, Shia Delaine J on Mon May 22, 2014  1:12 PM ------      Message from: Verner CholLEONARD, DEBORAH S      Created: Mon May 22, 2014  8:36 AM       Notify patient her urine culture was positive for E. Coli and Macrobid that she is on will not totally clear due to resistance. Stop Macrobid Rx Cipro sent to pharmacy for her to start for 5 days and then will need 2 Week TOC      Orders in      GC,Chlamydia,HIV,RPR, Hepatitis B results negative ------

## 2014-05-22 NOTE — Telephone Encounter (Signed)
Patient notified of results as written by provider 

## 2014-05-22 NOTE — Telephone Encounter (Signed)
lmtcb

## 2014-05-22 NOTE — Addendum Note (Signed)
Addended by: Verner CholLEONARD, DEBORAH S on: 05/22/2014 08:39 AM   Modules accepted: Orders, Medications

## 2014-06-01 ENCOUNTER — Encounter: Payer: Self-pay | Admitting: Certified Nurse Midwife

## 2014-06-01 ENCOUNTER — Ambulatory Visit (INDEPENDENT_AMBULATORY_CARE_PROVIDER_SITE_OTHER): Payer: BC Managed Care – PPO | Admitting: Certified Nurse Midwife

## 2014-06-01 VITALS — BP 102/60 | HR 60 | Resp 18 | Ht 65.5 in | Wt 140.0 lb

## 2014-06-01 DIAGNOSIS — N760 Acute vaginitis: Secondary | ICD-10-CM

## 2014-06-01 DIAGNOSIS — N390 Urinary tract infection, site not specified: Secondary | ICD-10-CM

## 2014-06-01 LAB — POCT URINALYSIS DIPSTICK
BILIRUBIN UA: NEGATIVE
GLUCOSE UA: NEGATIVE
Ketones, UA: NEGATIVE
Leukocytes, UA: NEGATIVE
Nitrite, UA: NEGATIVE
Protein, UA: NEGATIVE
RBC UA: NEGATIVE
Urobilinogen, UA: NEGATIVE
pH, UA: 6

## 2014-06-01 MED ORDER — NITROFURANTOIN MONOHYD MACRO 100 MG PO CAPS: ORAL_CAPSULE | ORAL | Status: AC

## 2014-06-01 NOTE — Progress Notes (Signed)
23 y.o. Single Caucasian female G0P0 here for follow up of E.Coli UTI and BV treated with Macrobid and switched to Cipro and Metrogel initiated on 05/18/14. Completed all medication as directed.  Denies any symptoms of urinary frequency,urgency or pain, vaginal discharge, itching or burning. "Feel so much better" .Patient has had several UTI post coital and desires prevention.   O: Healthy WD,WN female Affect: normal Skin:warm and dry Abdomen:soft,non tender, negative suprapubic CVAT negative, bilateral Pelvic exam:EXTERNAL GENITALIA: normal appearing vulva with no masses, tenderness or lesions Bladder, urethra, urethral meatus non tender VAGINA: no abnormal discharge or lesions and Wet Prep/KOH no pathogens, ph 4.0 CERVIX: no lesions or cervical motion tenderness and normal appearance UTERUS: anteverted and normal, nontender ADNEXA: no masses palpable and nontender  A:BV Resolved  UTI probably resolved Post coital UTI  P: Discussed findings of BV resolved and will send culture to make sure urine is negative now. Discussed use of Macrobid after sexual activity to see if this will prevent future occurrence. Patient desires trial. Instructions given for use. Be sure to empty bladder after sexual activity. Rx Macrobid see order  Labs  Urine culture  RV prn

## 2014-06-02 LAB — URINE CULTURE
Colony Count: NO GROWTH
ORGANISM ID, BACTERIA: NO GROWTH

## 2014-06-02 NOTE — Progress Notes (Signed)
Note reviewed, agree with plan.  Meiah Zamudio, MD  

## 2014-07-20 ENCOUNTER — Encounter: Payer: Self-pay | Admitting: Nurse Practitioner

## 2014-09-15 ENCOUNTER — Ambulatory Visit: Payer: BC Managed Care – PPO | Admitting: Nurse Practitioner

## 2014-09-20 ENCOUNTER — Ambulatory Visit: Payer: BC Managed Care – PPO | Admitting: Nurse Practitioner

## 2014-10-10 IMAGING — CT CT HEAD W/O CM
2 series · 16 of 30 positions shown, 20 images · non-contrast
Comparison: None.

CLINICAL DATA: 22-year-old female with increased frequency of
headaches and visual changes. Initial encounter.

EXAM:
CT HEAD WITHOUT CONTRAST
TECHNIQUE: Contiguous axial images were obtained from the base of the skull
through the vertex without intravenous contrast.

[Series 3: head bone · axial · 0.49mm/px · z∈[+27,+72]mm · 3 of 32 slices shown]
[im 3/32  bone]
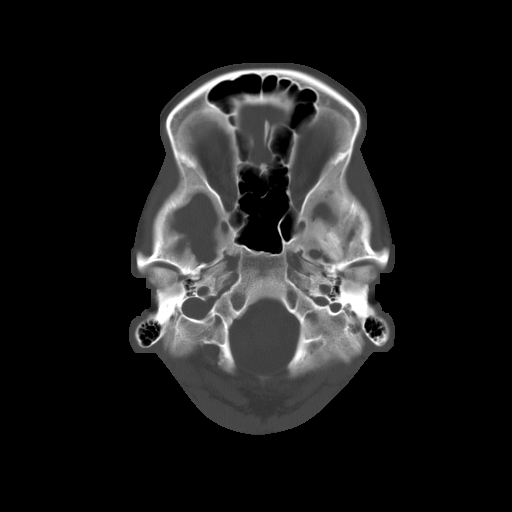
[im 7/32  bone]
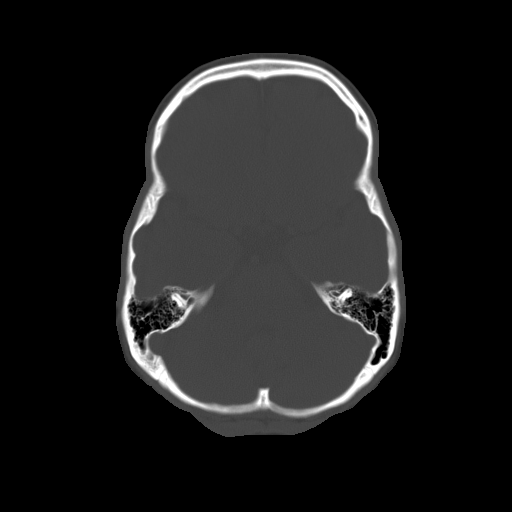
[im 12/32  bone]
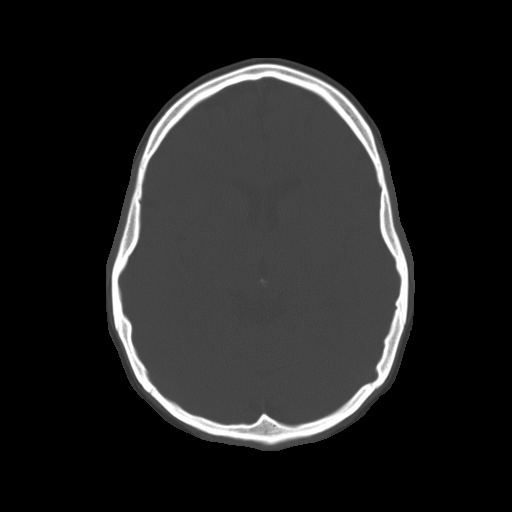

[Series 32: 3d filtered head w/o · axial · non-contrast · 0.49mm/px · z∈[+27,+158]mm · 13 of 32 slices shown, 17 images]
[im 3/32  brain]
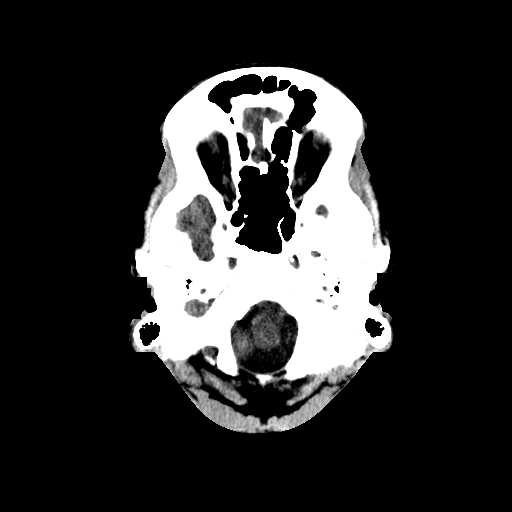
[im 3/32  bone]
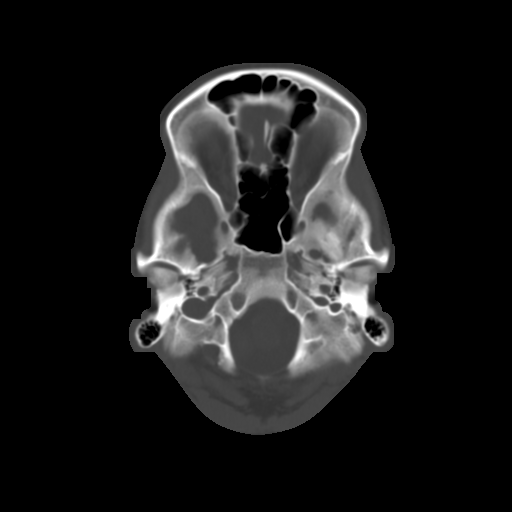
[im 5/32  brain]
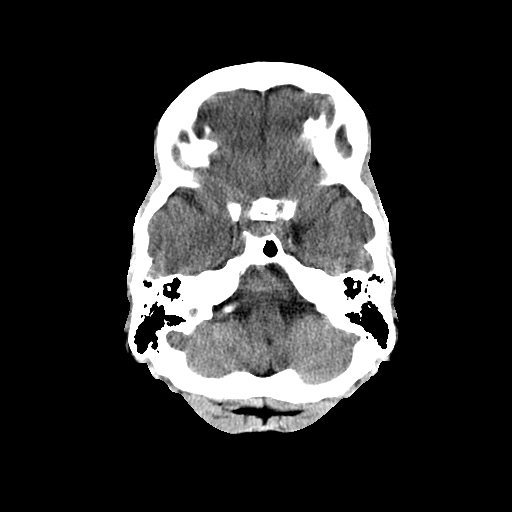
[im 7/32  brain]
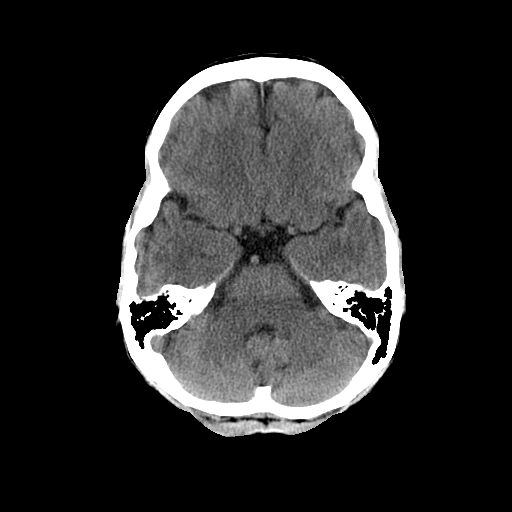
[im 9/32  brain]
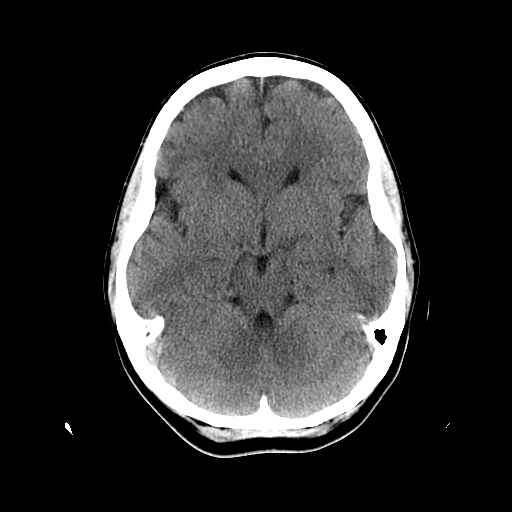
[im 12/32  brain]
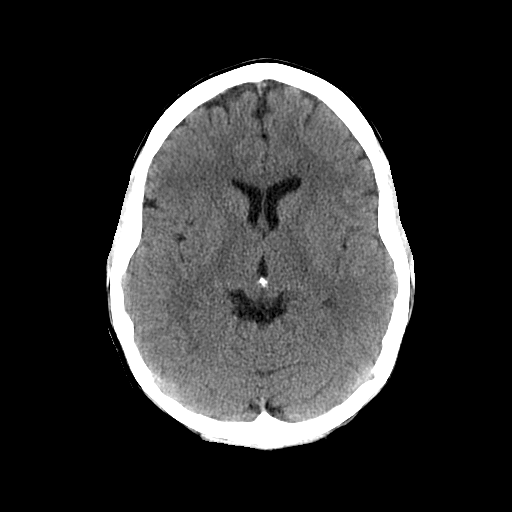
[im 12/32  bone]
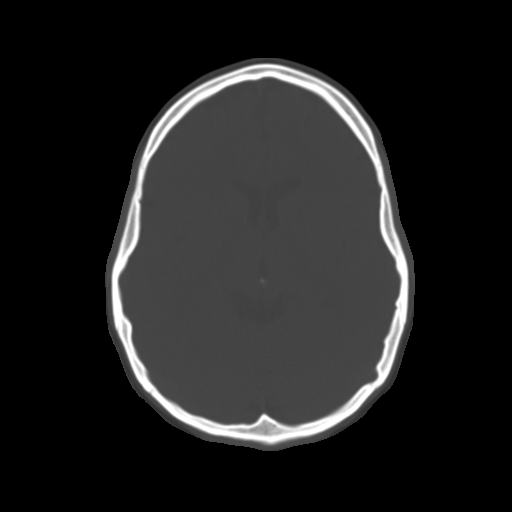
[im 14/32  brain]
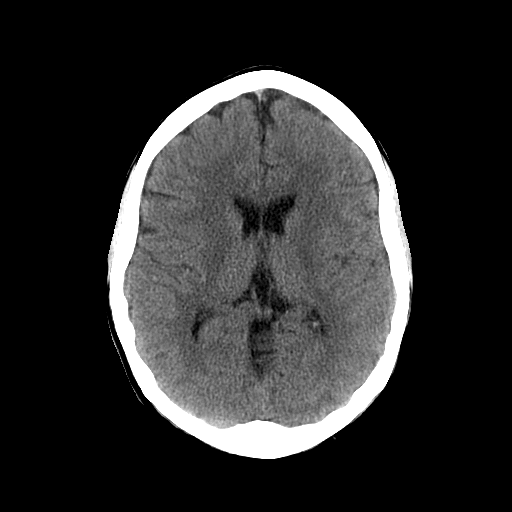
[im 16/32  brain]
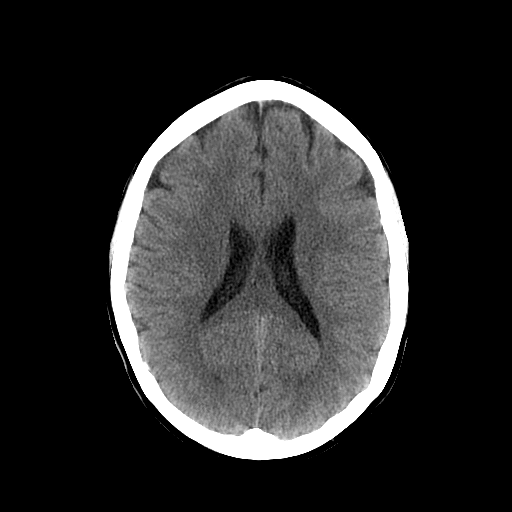
[im 18/32  brain]
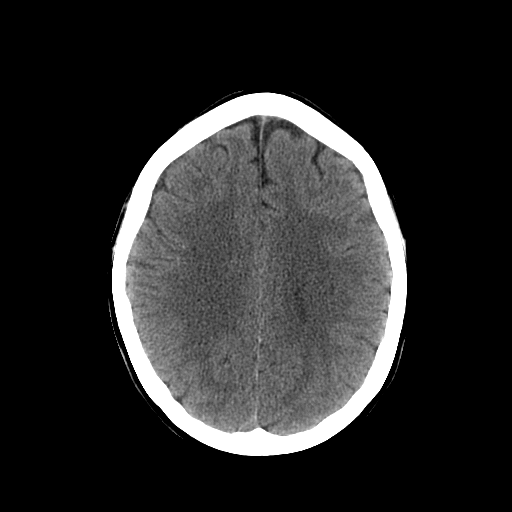
[im 20/32  brain]
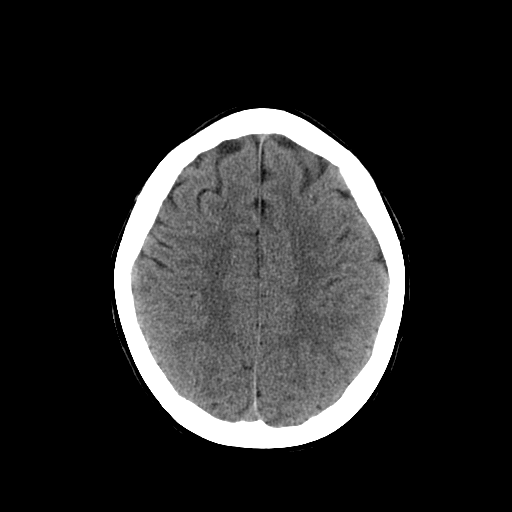
[im 20/32  bone]
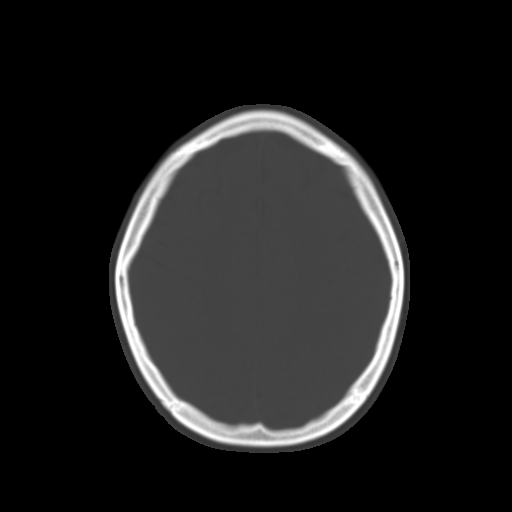
[im 23/32  brain]
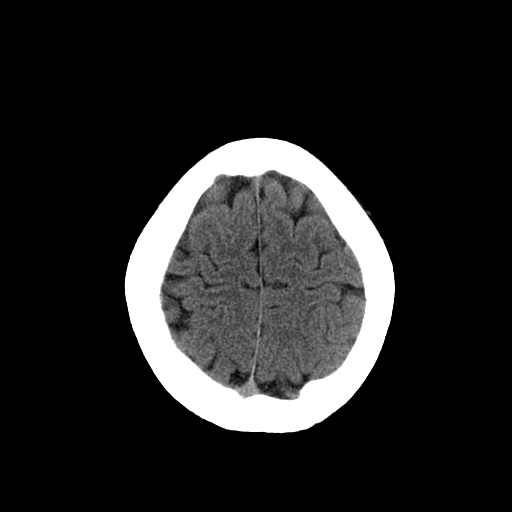
[im 25/32  brain]
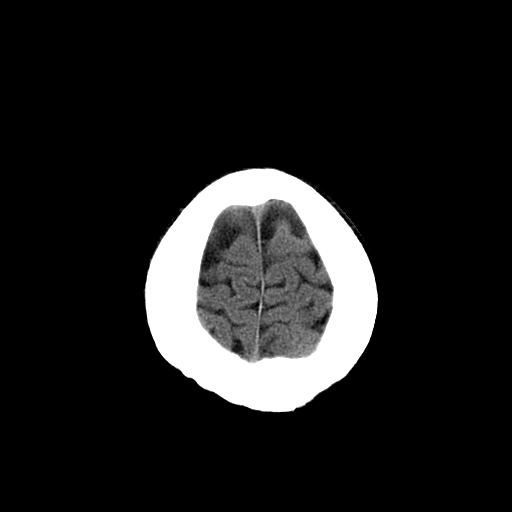
[im 27/32  brain]
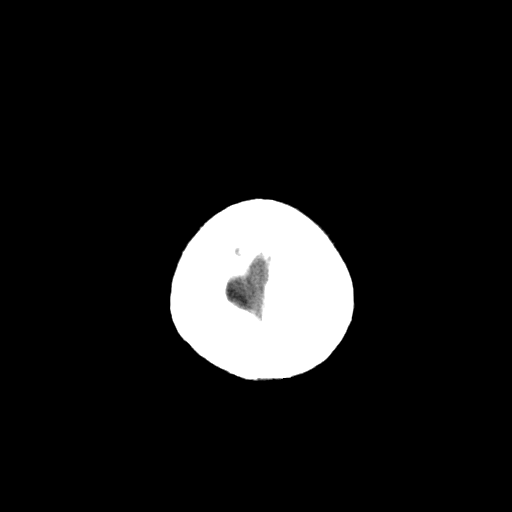
[im 29/32  brain]
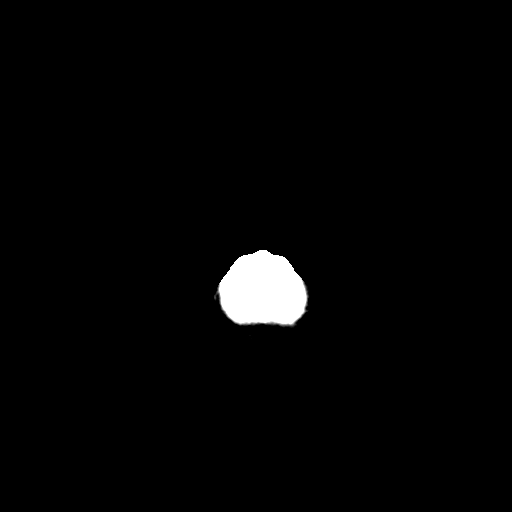
[im 29/32  bone]
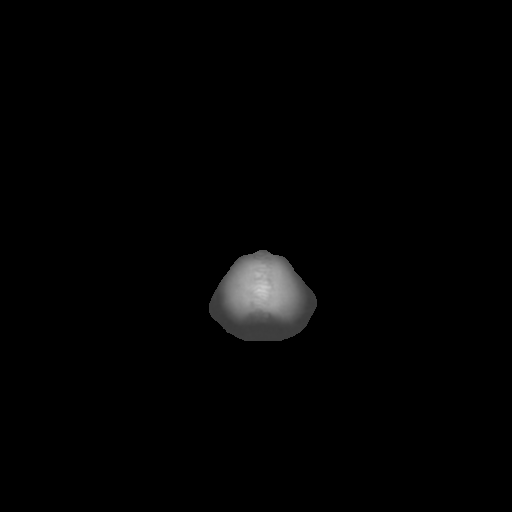

[16 of 30 positions shown; findings below may reference images not displayed]

FINDINGS: Visualized paranasal sinuses and mastoids are clear. Visualized
orbit soft tissues are within normal limits. No osseous abnormality
identified. Visualized scalp soft tissues are within normal limits.

Cerebral volume is normal. No midline shift, ventriculomegaly, mass
effect, evidence of mass lesion, intracranial hemorrhage or evidence
of cortically based acute infarction. Gray-white matter
differentiation is within normal limits throughout the brain.
Dominant appearing distal left vertebral artery incidentally noted.
No suspicious intracranial vascular hyperdensity.
IMPRESSION: Normal noncontrast CT appearance of the brain.

## 2014-10-26 ENCOUNTER — Telehealth: Payer: Self-pay | Admitting: Certified Nurse Midwife

## 2014-10-26 ENCOUNTER — Encounter: Payer: Self-pay | Admitting: Certified Nurse Midwife

## 2014-10-26 ENCOUNTER — Ambulatory Visit (INDEPENDENT_AMBULATORY_CARE_PROVIDER_SITE_OTHER): Payer: BC Managed Care – PPO | Admitting: Certified Nurse Midwife

## 2014-10-26 VITALS — BP 120/70 | HR 68 | Ht 64.75 in | Wt 139.0 lb

## 2014-10-26 DIAGNOSIS — Z30018 Encounter for initial prescription of other contraceptives: Secondary | ICD-10-CM

## 2014-10-26 DIAGNOSIS — Z01419 Encounter for gynecological examination (general) (routine) without abnormal findings: Secondary | ICD-10-CM

## 2014-10-26 DIAGNOSIS — Z0000 Encounter for general adult medical examination without abnormal findings: Secondary | ICD-10-CM

## 2014-10-26 LAB — POCT URINALYSIS DIPSTICK
BILIRUBIN UA: NEGATIVE
Blood, UA: NEGATIVE
GLUCOSE UA: NEGATIVE
Ketones, UA: NEGATIVE
LEUKOCYTES UA: NEGATIVE
NITRITE UA: NEGATIVE
Protein, UA: NEGATIVE
Urobilinogen, UA: NEGATIVE
pH, UA: 5

## 2014-10-26 LAB — HEMOGLOBIN, FINGERSTICK: Hemoglobin, fingerstick: 13.1 g/dL (ref 12.0–16.0)

## 2014-10-26 MED ORDER — NORETHINDRONE 0.35 MG PO TABS: 1.0000 | ORAL_TABLET | Freq: Every day | ORAL | Status: AC

## 2014-10-26 NOTE — Progress Notes (Signed)
23 y.o. G0P0 Single Caucasian Fe here for annual exam. Periods normal, no issues.No partner change, no STD concerns. Migraine with aura now. Patient on ? Beta blocker for control. Will check on type and let us know regarding contraception use/absorption change. Sees Urgent care if needed. No other health issues today..  Patient's last menstrual period was 10/16/2014.          Sexually active: Yes.    The current method of family planning is OCP (estrogen/progesterone).    Exercising: Yes.    Home exercise routine includes running about 3 days a week. Smoker:  no  Health Maintenance: Pap:  09/09/13 Neg HR HPV TDaP:  UTD Labs: HgB:  13.1        UA: neg, ph: 5.0   reports that she has never smoked. She has never used smokeless tobacco. She reports that she drinks about 1.0 oz of alcohol per week. She reports that she does not use illicit drugs.  Past Medical History  Diagnosis Date  . Dysmenorrhea   . Recurrent UTI 04/2014    Past Surgical History  Procedure Laterality Date  . Wisdom tooth extraction      Current Outpatient Prescriptions  Medication Sig Dispense Refill  . nitrofurantoin, macrocrystal-monohydrate, (MACROBID) 100 MG capsule One capsule after sexual activity as needed 30 capsule 2  . norethindrone-ethinyl estradiol (JUNEL FE,GILDESS FE,LOESTRIN FE) 1-20 MG-MCG tablet Take 1 tablet by mouth daily. 3 Package 3   No current facility-administered medications for this visit.    Family History  Problem Relation Age of Onset  . Osteoporosis Mother   . Thyroid disease Father     ROS:  Pertinent items are noted in HPI.  Otherwise, a comprehensive ROS was negative.  Exam:   BP 120/70 mmHg  Pulse 68  Ht 5' 4.75" (1.645 m)  Wt 139 lb (63.05 kg)  BMI 23.30 kg/m2  LMP 10/16/2014 Height: 5' 4.75" (164.5 cm)  Ht Readings from Last 3 Encounters:  10/26/14 5' 4.75" (1.645 m)  06/01/14 5' 5.5" (1.664 m)  05/18/14 5' 5.5" (1.664 m)    General appearance: alert,  cooperative and appears stated age Head: Normocephalic, without obvious abnormality, atraumatic Neck: no adenopathy, supple, symmetrical, trachea midline and thyroid normal to inspection and palpation Lungs: clear to auscultation bilaterally Breasts: normal appearance, no masses or tenderness, No nipple retraction or dimpling, No nipple discharge or bleeding, No axillary or supraclavicular adenopathy Heart: regular rate and rhythm Abdomen: soft, non-tender; no masses,  no organomegaly Extremities: extremities normal, atraumatic, no cyanosis or edema Skin: Skin color, texture, turgor normal. No rashes or lesions Lymph nodes: Cervical, supraclavicular, and axillary nodes normal. No abnormal inguinal nodes palpated Neurologic: Grossly normal   Pelvic: External genitalia:  no lesions              Urethra:  normal appearing urethra with no masses, tenderness or lesions              Bartholin's and Skene's: normal                 Vagina: normal appearing vagina with normal color and discharge, no lesions              Cervix: normal, non tender no lesions              Pap taken: No. Bimanual Exam:  Uterus:  normal size, contour, position, consistency, mobility, non-tender and anteverted  Adnexa: normal adnexa and no mass, fullness, tenderness               Rectovaginal: Confirms               Anus:  normal appearance  A:  Well Woman with normal exam  Contraception OCP will need change due to new diagnosis of migraine with aura  Migraine with aura with neurology management on medication ? type  P:   Reviewed health and wellness pertinent to exam  Discussed reason to change OCP due to increase vascular risk with Estrogen use. Also discussed with some medications for migraine use absorption is decreased which increases pregnancy risk. Patient given progesterone options and will decide together once medication information received.  Pap smear not taken today   counseled on breast  self exam, STD prevention, HIV risk factors and prevention, use and side effects of OCP's, adequate intake of calcium and vitamin D, diet and exercise  return annually or prn  An After Visit Summary was printed and given to the patient.

## 2014-10-26 NOTE — Telephone Encounter (Signed)
Routing to Deborah S. Leonard CNM to review and advise.   

## 2014-10-26 NOTE — Patient Instructions (Signed)
General topics  Next pap or exam is  due in 1 year Take a Women's multivitamin Take 1200 mg. of calcium daily - prefer dietary If any concerns in interim to call back  Breast Self-Awareness Practicing breast self-awareness may pick up problems early, prevent significant medical complications, and possibly save your life. By practicing breast self-awareness, you can become familiar with how your breasts look and feel and if your breasts are changing. This allows you to notice changes early. It can also offer you some reassurance that your breast health is good. One way to learn what is normal for your breasts and whether your breasts are changing is to do a breast self-exam. If you find a lump or something that was not present in the past, it is best to contact your caregiver right away. Other findings that should be evaluated by your caregiver include nipple discharge, especially if it is bloody; skin changes or reddening; areas where the skin seems to be pulled in (retracted); or new lumps and bumps. Breast pain is seldom associated with cancer (malignancy), but should also be evaluated by a caregiver. BREAST SELF-EXAM The best time to examine your breasts is 5 7 days after your menstrual period is over.  ExitCare Patient Information 2013 [employer].   Exercise to Stay Healthy Exercise helps you become and stay healthy. EXERCISE IDEAS AND TIPS Choose exercises that:  You enjoy.  Fit into your day. You do not need to exercise really hard to be healthy. You can do exercises at a slow or medium level and stay healthy. You can:  Stretch before and after working out.  Try yoga, Pilates, or tai chi.  Lift weights.  Walk fast, swim, jog, run, climb stairs, bicycle, dance, or rollerskate.  Take aerobic classes. Exercises that burn about 150 calories:  Running 1  miles in 15 minutes.  Playing volleyball for 45 to 60 minutes.  Washing and waxing a car for 45 to 60  minutes.  Playing touch football for 45 minutes.  Walking 1  miles in 35 minutes.  Pushing a stroller 1  miles in 30 minutes.  Playing basketball for 30 minutes.  Raking leaves for 30 minutes.  Bicycling 5 miles in 30 minutes.  Walking 2 miles in 30 minutes.  Dancing for 30 minutes.  Shoveling snow for 15 minutes.  Swimming laps for 20 minutes.  Walking up stairs for 15 minutes.  Bicycling 4 miles in 15 minutes.  Gardening for 30 to 45 minutes.  Jumping rope for 15 minutes.  Washing windows or floors for 45 to 60 minutes. Document Released: 11/29/2010 Document Revised: 01/19/2012 Document Reviewed: 11/29/2010 ExitCare Patient Information 2013 [employer].   Other topics ( that may be useful information):    Sexually Transmitted Disease Sexually transmitted disease (STD) refers to any infection that is passed from person to person during sexual activity. This may happen by way of saliva, semen, blood, vaginal mucus, or urine. Common STDs include:  Gonorrhea.  Chlamydia.  Syphilis.  HIV/AIDS.  Genital herpes.  Hepatitis B and C.  Trichomonas.  Human papillomavirus (HPV).  Pubic lice. CAUSES  An STD may be spread by bacteria, virus, or parasite. A person can get an STD by:  Sexual intercourse with an infected person.  Sharing sex toys with an infected person.  Sharing needles with an infected person.  Having intimate contact with the genitals, mouth, or rectal areas of an infected person. SYMPTOMS  Some people may not have any symptoms, but   they can still pass the infection to others. Different STDs have different symptoms. Symptoms include:  Painful or bloody urination.  Pain in the pelvis, abdomen, vagina, anus, throat, or eyes.  Skin rash, itching, irritation, growths, or sores (lesions). These usually occur in the genital or anal area.  Abnormal vaginal discharge.  Penile discharge in men.  Soft, flesh-colored skin growths in the  genital or anal area.  Fever.  Pain or bleeding during sexual intercourse.  Swollen glands in the groin area.  Yellow skin and eyes (jaundice). This is seen with hepatitis. DIAGNOSIS  To make a diagnosis, your caregiver may:  Take a medical history.  Perform a physical exam.  Take a specimen (culture) to be examined.  Examine a sample of discharge under a microscope.  Perform blood test TREATMENT   Chlamydia, gonorrhea, trichomonas, and syphilis can be cured with antibiotic medicine.  Genital herpes, hepatitis, and HIV can be treated, but not cured, with prescribed medicines. The medicines will lessen the symptoms.  Genital warts from HPV can be treated with medicine or by freezing, burning (electrocautery), or surgery. Warts may come back.  HPV is a virus and cannot be cured with medicine or surgery.However, abnormal areas may be followed very closely by your caregiver and may be removed from the cervix, vagina, or vulva through office procedures or surgery. If your diagnosis is confirmed, your recent sexual partners need treatment. This is true even if they are symptom-free or have a negative culture or evaluation. They should not have sex until their caregiver says it is okay. HOME CARE INSTRUCTIONS  All sexual partners should be informed, tested, and treated for all STDs.  Take your antibiotics as directed. Finish them even if you start to feel better.  Only take over-the-counter or prescription medicines for pain, discomfort, or fever as directed by your caregiver.  Rest.  Eat a balanced diet and drink enough fluids to keep your urine clear or pale yellow.  Do not have sex until treatment is completed and you have followed up with your caregiver. STDs should be checked after treatment.  Keep all follow-up appointments, Pap tests, and blood tests as directed by your caregiver.  Only use latex condoms and water-soluble lubricants during sexual activity. Do not use  petroleum jelly or oils.  Avoid alcohol and illegal drugs.  Get vaccinated for HPV and hepatitis. If you have not received these vaccines in the past, talk to your caregiver about whether one or both might be right for you.  Avoid risky sex practices that can break the skin. The only way to avoid getting an STD is to avoid all sexual activity.Latex condoms and dental dams (for oral sex) will help lessen the risk of getting an STD, but will not completely eliminate the risk. SEEK MEDICAL CARE IF:   You have a fever.  You have any new or worsening symptoms. Document Released: 01/17/2003 Document Revised: 01/19/2012 Document Reviewed: 01/24/2011 [facility] Patient Information 2013 [employer].    Domestic Abuse You are being battered or abused if someone close to you hits, pushes, or physically hurts you in any way. You also are being abused if you are forced into activities. You are being sexually abused if you are forced to have sexual contact of any kind. You are being emotionally abused if you are made to feel worthless or if you are constantly threatened. It is important to remember that help is available. No one has the right to abuse you. PREVENTION OF FURTHER  ABUSE  Learn the warning signs of danger. This varies with situations but may include: the use of alcohol, threats, isolation from friends and family, or forced sexual contact. Leave if you feel that violence is going to occur.  If you are attacked or beaten, report it to the police so the abuse is documented. You do not have to press charges. The police can protect you while you or the attackers are leaving. Get the officer's name and badge number and a copy of the report.  Find someone you can trust and tell them what is happening to you: your caregiver, a nurse, clergy member, close friend or family member. Feeling ashamed is natural, but remember that you have done nothing wrong. No one deserves abuse. Document Released:  10/24/2000 Document Revised: 01/19/2012 Document Reviewed: 01/02/2011 ExitCare Patient Information 2013 [employer].    How Much is Too Much Alcohol? Drinking too much alcohol can cause injury, accidents, and health problems. These types of problems can include:   Car crashes.  Falls.  Family fighting (domestic violence).  Drowning.  Fights.  Injuries.  Burns.  Damage to certain organs.  Having a baby with birth defects. ONE DRINK CAN BE TOO MUCH WHEN YOU ARE:  Working.  Pregnant or breastfeeding.  Taking medicines. Ask your doctor.  Driving or planning to drive. If you or someone you know has a drinking problem, get help from a doctor.  Document Released: 08/23/2009 Document Revised: 01/19/2012 Document Reviewed: 08/23/2009 ExitCare Patient Information 2013 [employer].   Smoking Hazards Smoking cigarettes is extremely bad for your health. Tobacco smoke has over 200 known poisons in it. There are over 60 chemicals in tobacco smoke that cause cancer. Some of the chemicals found in cigarette smoke include:   Cyanide.  Benzene.  Formaldehyde.  Methanol (wood alcohol).  Acetylene (fuel used in welding torches).  Ammonia. Cigarette smoke also contains the poisonous gases nitrogen oxide and carbon monoxide.  Cigarette smokers have an increased risk of many serious medical problems and Smoking causes approximately:  90% of all lung cancer deaths in men.  80% of all lung cancer deaths in women.  90% of deaths from chronic obstructive lung disease. Compared with nonsmokers, smoking increases the risk of:  Coronary heart disease by 2 to 4 times.  Stroke by 2 to 4 times.  Men developing lung cancer by 23 times.  Women developing lung cancer by 13 times.  Dying from chronic obstructive lung diseases by 12 times.  . Smoking is the most preventable cause of death and disease in our society.  WHY IS SMOKING ADDICTIVE?  Nicotine is the chemical  agent in tobacco that is capable of causing addiction or dependence.  When you smoke and inhale, nicotine is absorbed rapidly into the bloodstream through your lungs. Nicotine absorbed through the lungs is capable of creating a powerful addiction. Both inhaled and non-inhaled nicotine may be addictive.  Addiction studies of cigarettes and spit tobacco show that addiction to nicotine occurs mainly during the teen years, when young people begin using tobacco products. WHAT ARE THE BENEFITS OF QUITTING?  There are many health benefits to quitting smoking.   Likelihood of developing cancer and heart disease decreases. Health improvements are seen almost immediately.  Blood pressure, pulse rate, and breathing patterns start returning to normal soon after quitting. QUITTING SMOKING   American Lung Association - 1-800-LUNGUSA  American Cancer Society - 1-800-ACS-2345 Document Released: 12/04/2004 Document Revised: 01/19/2012 Document Reviewed: 08/08/2009 ExitCare Patient Information 2013 [employer].   Stress Management Stress is a state of physical or mental tension that often results from changes in your life or normal routine. Some common causes of stress are:  Death of a loved one.  Injuries or severe illnesses.  Getting fired or changing jobs.  Moving into a new home. Other causes may be:  Sexual problems.  Business or financial losses.  Taking on a large debt.  Regular conflict with someone at home or at work.  Constant tiredness from lack of sleep. It is not just bad things that are stressful. It may be stressful to:  Win the lottery.  Get married.  Buy a new car. The amount of stress that can be easily tolerated varies from person to person. Changes generally cause stress, regardless of the types of change. Too much stress can affect your health. It may lead to physical or emotional problems. Too little stress (boredom) may also become stressful. SUGGESTIONS TO  REDUCE STRESS:  Talk things over with your family and friends. It often is helpful to share your concerns and worries. If you feel your problem is serious, you may want to get help from a professional counselor.  Consider your problems one at a time instead of lumping them all together. Trying to take care of everything at once may seem impossible. List all the things you need to do and then start with the most important one. Set a goal to accomplish 2 or 3 things each day. If you expect to do too many in a single day you will naturally fail, causing you to feel even more stressed.  Do not use alcohol or drugs to relieve stress. Although you may feel better for a short time, they do not remove the problems that caused the stress. They can also be habit forming.  Exercise regularly - at least 3 times per week. Physical exercise can help to relieve that "uptight" feeling and will relax you.  The shortest distance between despair and hope is often a good night's sleep.  Go to bed and get up on time allowing yourself time for appointments without being rushed.  Take a short "time-out" period from any stressful situation that occurs during the day. Close your eyes and take some deep breaths. Starting with the muscles in your face, tense them, hold it for a few seconds, then relax. Repeat this with the muscles in your neck, shoulders, hand, stomach, back and legs.  Take good care of yourself. Eat a balanced diet and get plenty of rest.  Schedule time for having fun. Take a break from your daily routine to relax. HOME CARE INSTRUCTIONS   Call if you feel overwhelmed by your problems and feel you can no longer manage them on your own.  Return immediately if you feel like hurting yourself or someone else. Document Released: 04/22/2001 Document Revised: 01/19/2012 Document Reviewed: 12/13/2007 ExitCare Patient Information 2013 [employer].   

## 2014-10-26 NOTE — Telephone Encounter (Signed)
Notify patient that she can do oral POP with this medication. Will place order for Camilla to start when she finishes her current pack. She does not have a off week with this type of pill. Takes same dosage daily. Very important that she understands this. Switching from OCP due migraine with aura.

## 2014-10-26 NOTE — Telephone Encounter (Signed)
(  Generic inderal la) Patient was told to call with the name of this medication. Patient las last seen today.

## 2014-10-27 NOTE — Progress Notes (Signed)
Reviewed personally.  M. Suzanne Jashae Wiggs, MD.  

## 2014-10-27 NOTE — Telephone Encounter (Signed)
All to patient and notified of instructions from Debbi. Explained importance of taking pill same time daily and that all pills are active pills. May take a few cycles to adjust to new regimen. Discussed potential bleeding profile with progesterone only pill. Call with update after trying this pill for several months. Patient agreeable. Encounter closed

## 2015-04-16 ENCOUNTER — Other Ambulatory Visit: Payer: Self-pay | Admitting: Nurse Practitioner

## 2015-04-17 ENCOUNTER — Encounter: Payer: Self-pay | Admitting: Certified Nurse Midwife

## 2015-04-17 ENCOUNTER — Ambulatory Visit (INDEPENDENT_AMBULATORY_CARE_PROVIDER_SITE_OTHER): Payer: BLUE CROSS/BLUE SHIELD | Admitting: Certified Nurse Midwife

## 2015-04-17 VITALS — BP 90/60 | HR 68 | Resp 16 | Ht 64.75 in | Wt 144.0 lb

## 2015-04-17 DIAGNOSIS — B3731 Acute candidiasis of vulva and vagina: Secondary | ICD-10-CM

## 2015-04-17 DIAGNOSIS — B373 Candidiasis of vulva and vagina: Secondary | ICD-10-CM | POA: Diagnosis not present

## 2015-04-17 MED ORDER — NYSTATIN-TRIAMCINOLONE 100000-0.1 UNIT/GM-% EX CREA: 1.0000 "application " | TOPICAL_CREAM | Freq: Two times a day (BID) | CUTANEOUS | Status: AC

## 2015-04-17 MED ORDER — HYLAFEM VA SUPP: 1.0000 | Freq: Every day | VAGINAL | Status: AC

## 2015-04-17 NOTE — Progress Notes (Signed)
Reviewed personally.  M. Suzanne Tahsin Benyo, MD.  

## 2015-04-17 NOTE — Patient Instructions (Signed)

## 2015-04-17 NOTE — Progress Notes (Signed)
23 y.o.Single white female g0p0 here with complaint of vaginal symptoms of itching, burning, and increase discharge. Describes discharge as white with slight odor.Onset of symptoms 7 days ago. Denies new personal products. Wears thongs, swims frequently and works out in gym, staying in clothes for prolonged times.No STD concerns. Urinary symptoms none . Contraception is Arna Mediciora Be due migraine with aura.   O:Healthy female WDWN Affect: normal, orientation x 3  Exam: Abdomen:soft non tender Lymph node: no enlargement or tenderness Pelvic exam: External genital: normal female, with redness and exudate wet prep taken, small superficial laceration noted under clitoral hood area, slightly tender, no bleeding noted BUS: negative Vagina: white thick discharge noted. Ph:4.0   ,Wet prep taken,  Cervix: normal, non tender, no CMT Uterus: normal, non tender Adnexa:normal, non tender, no masses or fullness noted   Wet Prep results: positive for yeast vagina/vulva   A:Normal pelvic exam Yeast vaginitis/vulvitis   P:Discussed findings of yeast vaginitis/vulvitis and etiology. Discussed Aveeno or baking soda sitz bath for comfort. Avoid moist clothes or pads for extended period of time. If working out in gym clothes or swim suits for long periods of time change underwear or bottoms of swimsuit if possible. Coconut Oil use for skin protection prior to activity can be used to external skin. Limit thong use to decrease superficial laceration occurrence with prolonged use. Rx: Hylafem see order with instructions, patient aware it may need to be ordered which is not a problem for use, Rx Mycolog with instructions. Questions addressed.   Rv prn

## 2015-11-13 ENCOUNTER — Ambulatory Visit: Payer: BC Managed Care – PPO | Admitting: Certified Nurse Midwife
